# Patient Record
Sex: Male | Born: 1955 | Race: Black or African American | Hispanic: No | Marital: Single | Smoking: Former smoker
Health system: Southern US, Community
[De-identification: ages and names within clinical notes are randomized; demographics above are authoritative.]

---

## 2004-01-04 ENCOUNTER — Other Ambulatory Visit: Payer: Self-pay

## 2006-02-10 ENCOUNTER — Ambulatory Visit: Payer: Self-pay | Admitting: Gastroenterology

## 2006-12-20 ENCOUNTER — Ambulatory Visit: Payer: Self-pay | Admitting: Orthopedic Surgery

## 2007-01-16 ENCOUNTER — Encounter: Admission: RE | Admit: 2007-01-16 | Discharge: 2007-01-16 | Payer: Self-pay | Admitting: Neurosurgery

## 2007-01-26 ENCOUNTER — Emergency Department: Payer: Self-pay | Admitting: Unknown Physician Specialty

## 2007-01-27 ENCOUNTER — Emergency Department: Payer: Self-pay

## 2007-03-14 ENCOUNTER — Encounter: Admission: RE | Admit: 2007-03-14 | Discharge: 2007-03-14 | Payer: Self-pay | Admitting: Neurosurgery

## 2009-01-21 ENCOUNTER — Emergency Department: Payer: Self-pay | Admitting: Internal Medicine

## 2011-06-07 ENCOUNTER — Inpatient Hospital Stay: Payer: Self-pay | Admitting: Specialist

## 2011-09-06 ENCOUNTER — Observation Stay: Payer: Self-pay | Admitting: Internal Medicine

## 2011-09-06 LAB — COMPREHENSIVE METABOLIC PANEL
Albumin: 4.4 g/dL (ref 3.4–5.0)
Alkaline Phosphatase: 105 U/L (ref 50–136)
Anion Gap: 35 — ABNORMAL HIGH (ref 7–16)
Calcium, Total: 9.4 mg/dL (ref 8.5–10.1)
Co2: 4 mmol/L — CL (ref 21–32)
EGFR (Non-African Amer.): >60
Glucose: 162 mg/dL — ABNORMAL HIGH (ref 65–99)
SGOT(AST): 76 U/L — ABNORMAL HIGH (ref 15–37)

## 2011-09-06 LAB — URINALYSIS, COMPLETE
Ph: 5 (ref 4.5–8.0)
Protein: 100
RBC,UR: 9 /HPF (ref 0–5)
Specific Gravity: 1.015 (ref 1.003–1.030)
Squamous Epithelial: NONE SEEN
WBC UR: 4 /HPF (ref 0–5)

## 2011-09-06 LAB — CBC
MCH: 30.4 pg (ref 26.0–34.0)
Platelet: 217 10*3/uL (ref 150–440)
RBC: 5.34 10*6/uL (ref 4.40–5.90)
RDW: 17.5 % — ABNORMAL HIGH (ref 11.5–14.5)

## 2011-09-06 LAB — ETHANOL
Ethanol %: <0.003 % (ref 0.000–0.080)
Ethanol: <3 mg/dL

## 2011-09-06 LAB — DRUG SCREEN, URINE
Amphetamines, Ur Screen: NEGATIVE (ref ?–1000)
Methadone, Ur Screen: NEGATIVE (ref ?–300)
Opiate, Ur Screen: NEGATIVE (ref ?–300)
Tricyclic, Ur Screen: NEGATIVE (ref ?–1000)

## 2011-09-06 LAB — CK: CK, Total: 205 U/L (ref 35–232)

## 2011-09-07 LAB — BASIC METABOLIC PANEL
Anion Gap: 13 (ref 7–16)
BUN: 11 mg/dL (ref 7–18)
Chloride: 106 mmol/L (ref 98–107)
Creatinine: 1.22 mg/dL (ref 0.60–1.30)
EGFR (Non-African Amer.): >60
Potassium: 3.9 mmol/L (ref 3.5–5.1)

## 2011-09-07 LAB — CBC WITH DIFFERENTIAL/PLATELET
Basophil #: 0 10*3/uL (ref 0.0–0.1)
Eosinophil %: 0.2 %
HGB: 13.1 g/dL (ref 13.0–18.0)
Monocyte %: 13.4 %
Neutrophil %: 60.6 %
Platelet: 156 10*3/uL (ref 150–440)
RBC: 4.32 10*6/uL — ABNORMAL LOW (ref 4.40–5.90)
WBC: 9.8 10*3/uL (ref 3.8–10.6)

## 2011-09-07 LAB — TROPONIN I: Troponin-I: 0.1 ng/mL — ABNORMAL HIGH

## 2011-09-08 LAB — PROT IMMUNOELECTROPHORES(ARMC)

## 2012-12-20 ENCOUNTER — Emergency Department: Payer: Self-pay | Admitting: Emergency Medicine

## 2013-03-28 IMAGING — CT CT HEAD WITHOUT CONTRAST
2 series · 15 of 30 positions shown, 19 images · non-contrast
Comparison: none

REASON FOR EXAM: seizures
COMMENTS:

PROCEDURE:     CT  - CT HEAD WITHOUT CONTRAST  - September 06, 2011  [DATE]
RESULT:     Comparison:  None
TECHNIQUE: Multiple axial images from the foramen magnum to the vertex were
obtained without IV contrast.

[Series 2: without · axial · non-contrast · 0.45mm/px · z∈[-167,-27]mm · 13 of 34 slices shown, 17 images]
[im 3/34  brain]
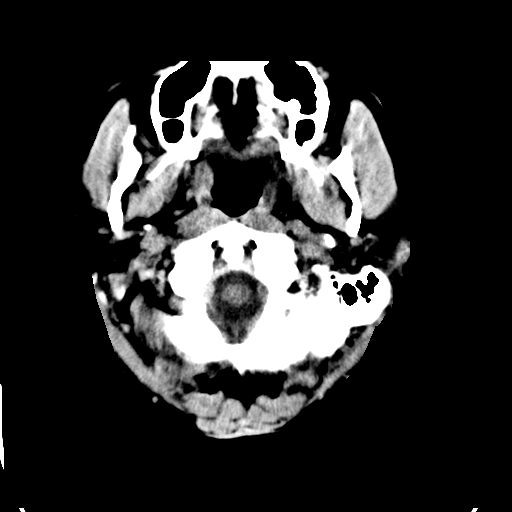
[im 3/34  bone]
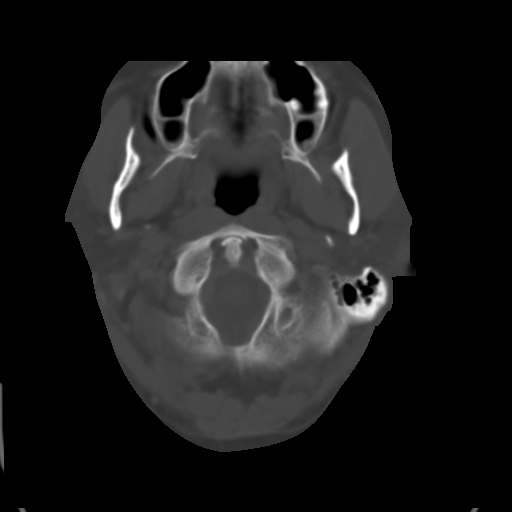
[im 5/34  brain]
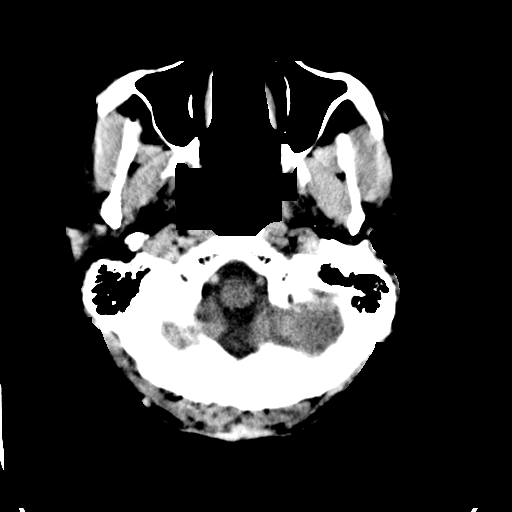
[im 8/34  brain]
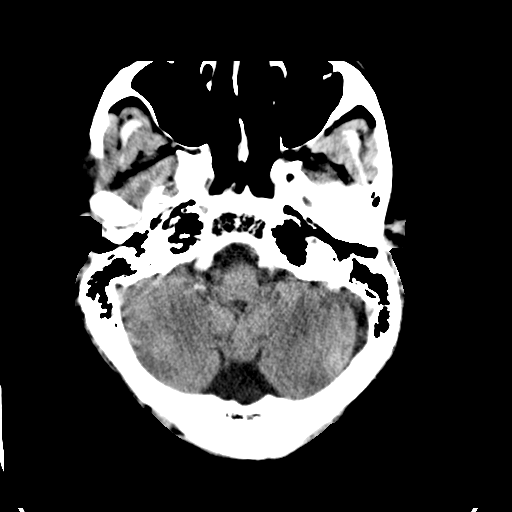
[im 10/34  brain]
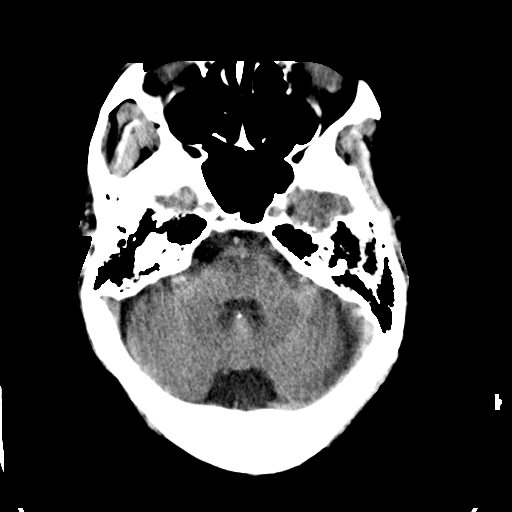
[im 12/34  brain]
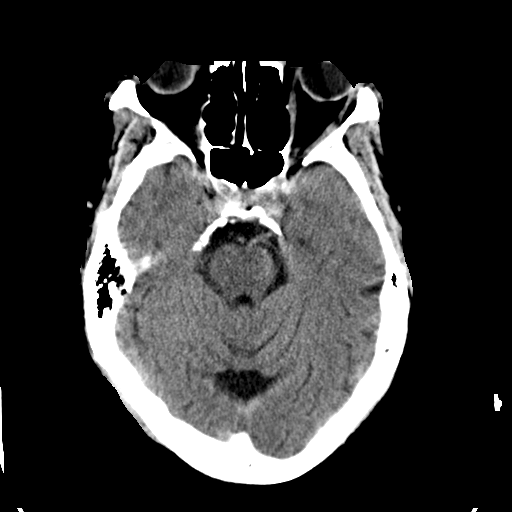
[im 12/34  bone]
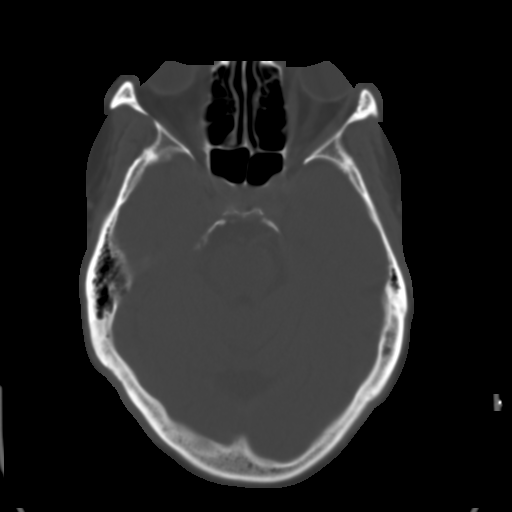
[im 15/34  brain]
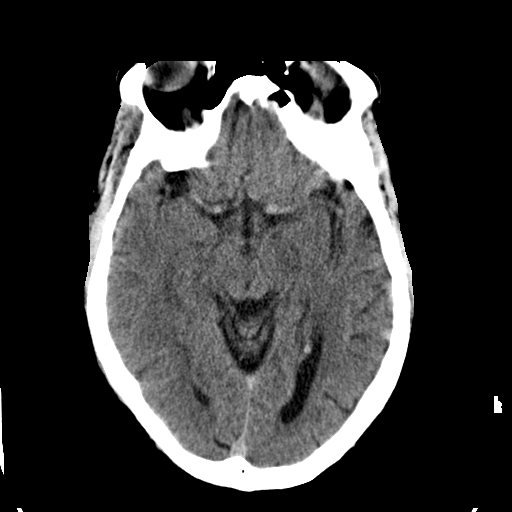
[im 17/34  brain]
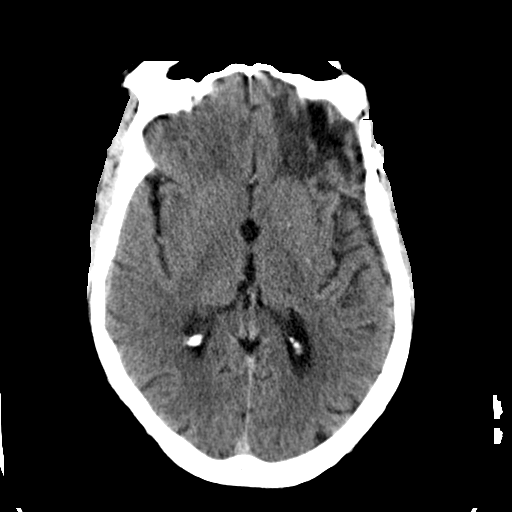
[im 19/34  brain]
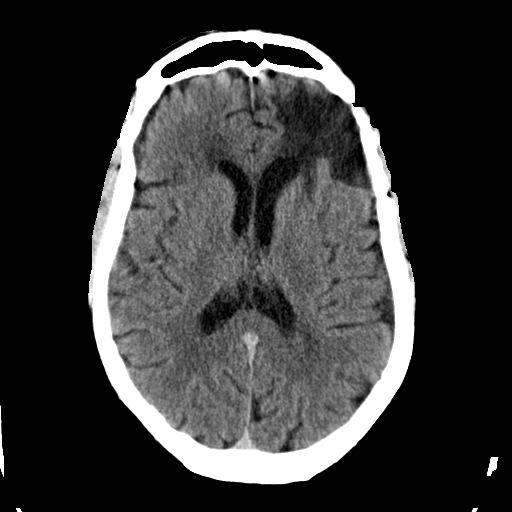
[im 22/34  brain]
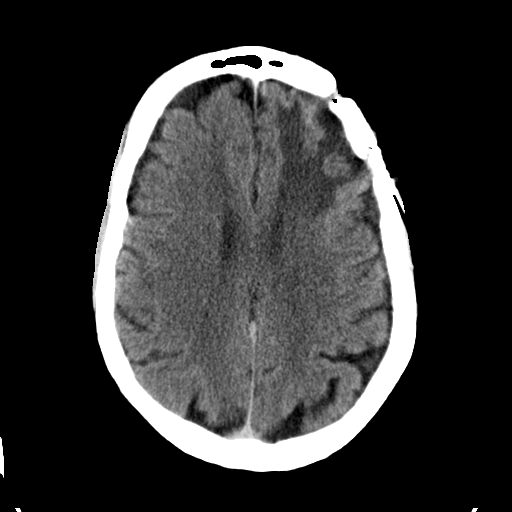
[im 22/34  bone]
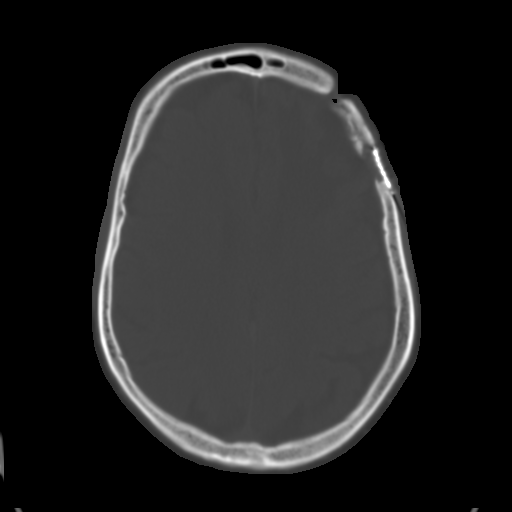
[im 24/34  brain]
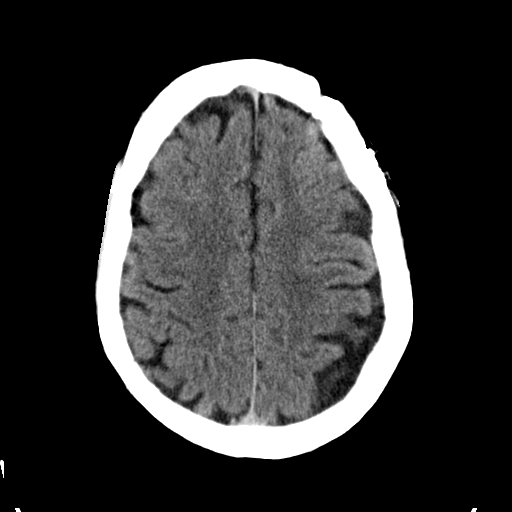
[im 26/34  brain]
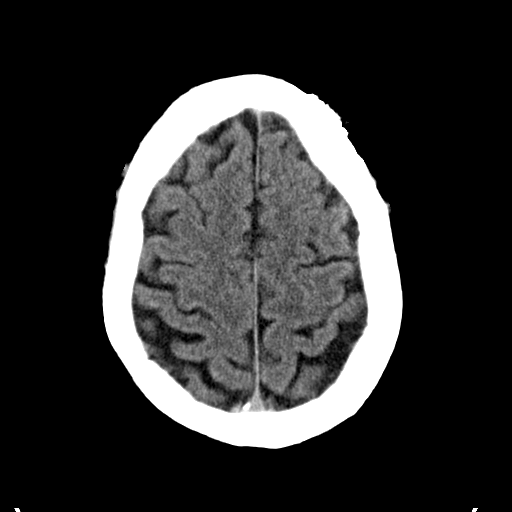
[im 29/34  brain]
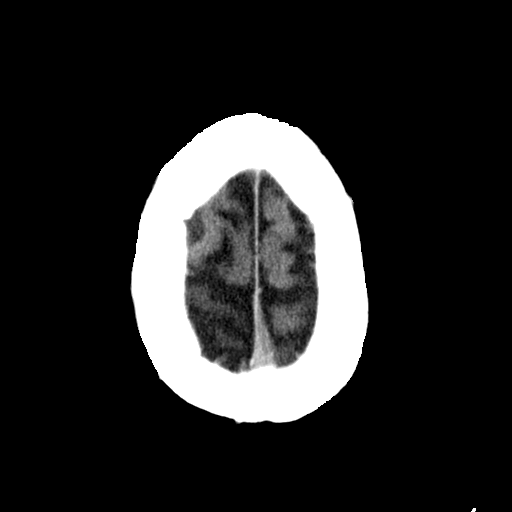
[im 31/34  brain]
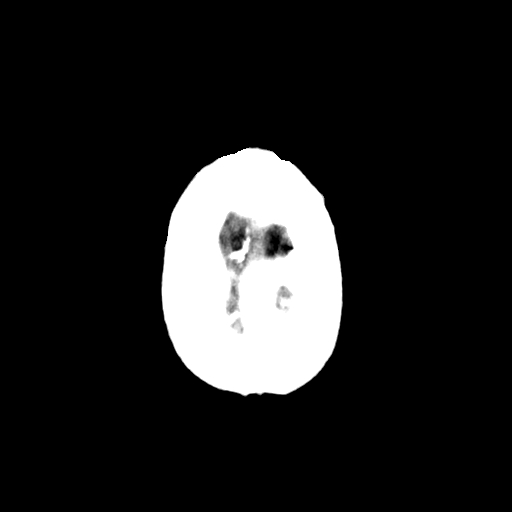
[im 31/34  bone]
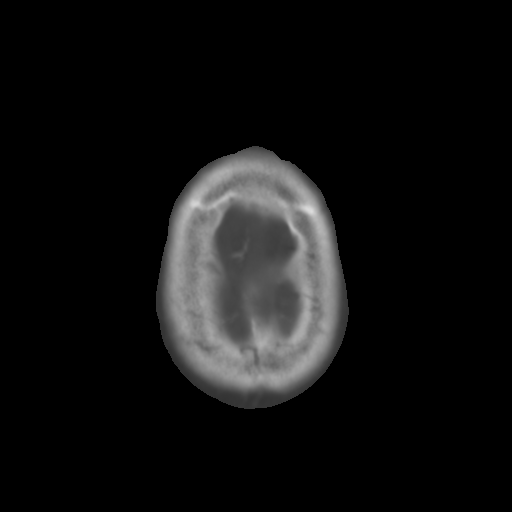

[Series 3: bone · axial · 0.45mm/px · z∈[-167,-142]mm · 2 of 34 slices shown]
[im 3/34  bone]
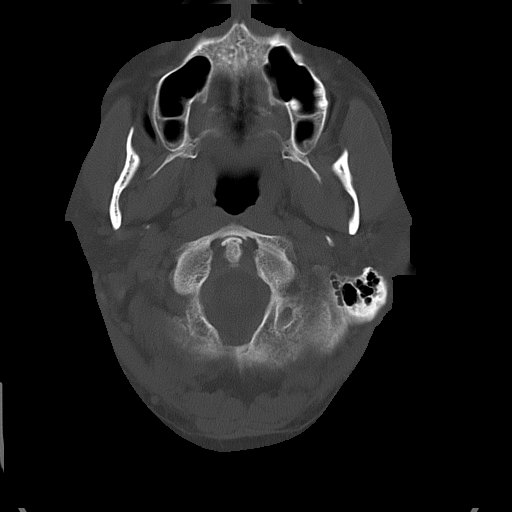
[im 8/34  bone]
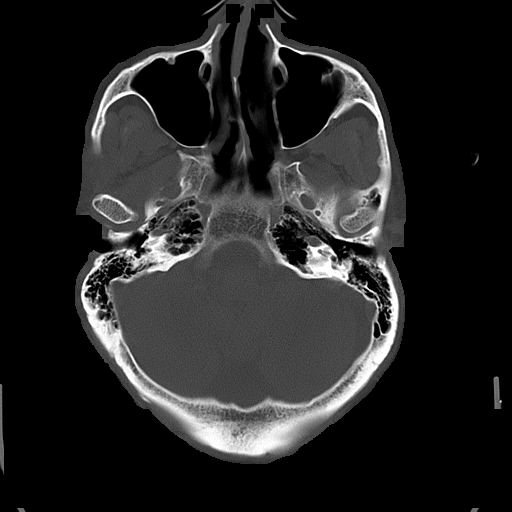

[15 of 30 positions shown; findings below may reference images not displayed]

FINDINGS: There is no evidence of mass effect, midline shift, or extra-axial fluid
collections.  There is no evidence of a space-occupying lesion or
intracranial hemorrhage. There is no evidence of a cortical-based area of
acute infarction. There is left frontal encephalomalacia. There is
generalized cerebral atrophy. There is periventricular white matter low
attenuation likely secondary to microangiopathy.

The ventricles and sulci are appropriate for the patient's age. The basal
cisterns are patent.

Visualized portions of the orbits are unremarkable. The visualized portions
of the paranasal sinuses and mastoid air cells are unremarkable.
Cerebrovascular atherosclerotic calcifications are noted.

There is a left frontal craniotomy.
IMPRESSION: No acute intracranial process.

## 2013-03-29 IMAGING — CR ORBITS FOR FOREIGN BODY - 2 VIEW
1 series · 3 of 3 positions shown · non-contrast
Comparison: none

REASON FOR EXAM: metal clearance
COMMENTS:

PROCEDURE:     DXR - DXR ORBITS FOR MRI CLEARANCE  - September 07, 2011  [DATE]
RESULT:     Metallic density is noted over the right orbit. MRI cannot be
performed. The patient has had a prior craniotomy.

[Series 1: ap · 0.17mm/px · 3 of 3 slices shown]
[im 1/3]
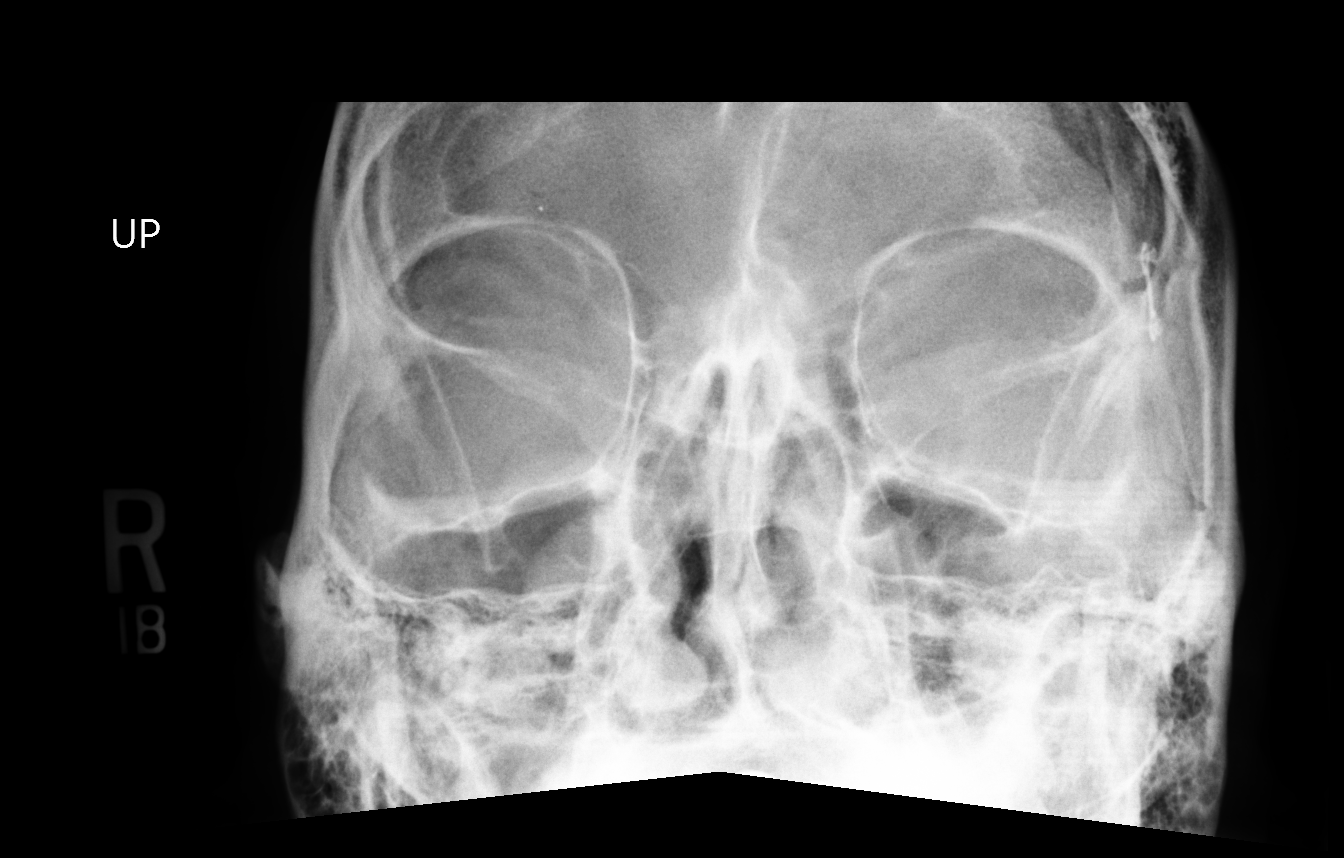
[im 2/3]
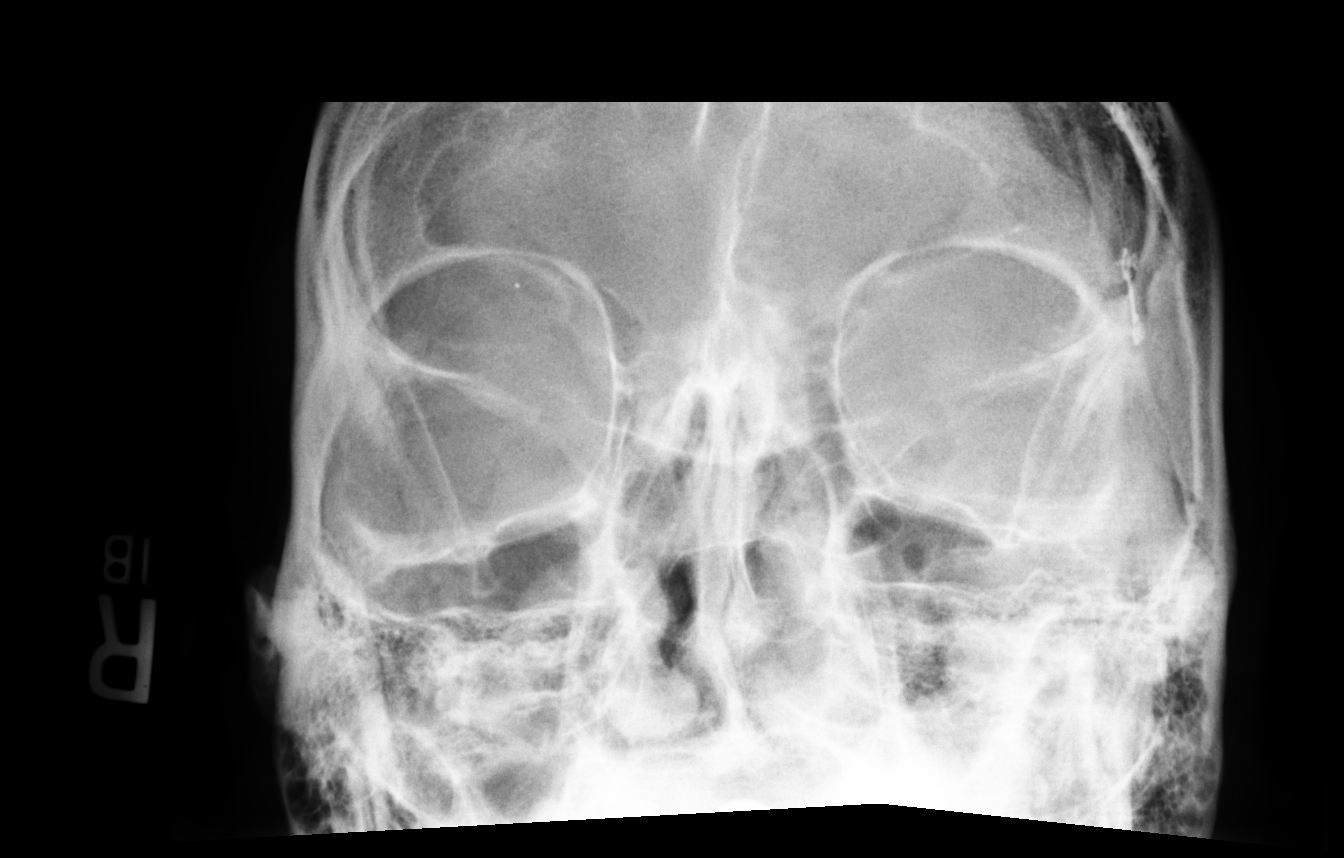
[im 3/3]
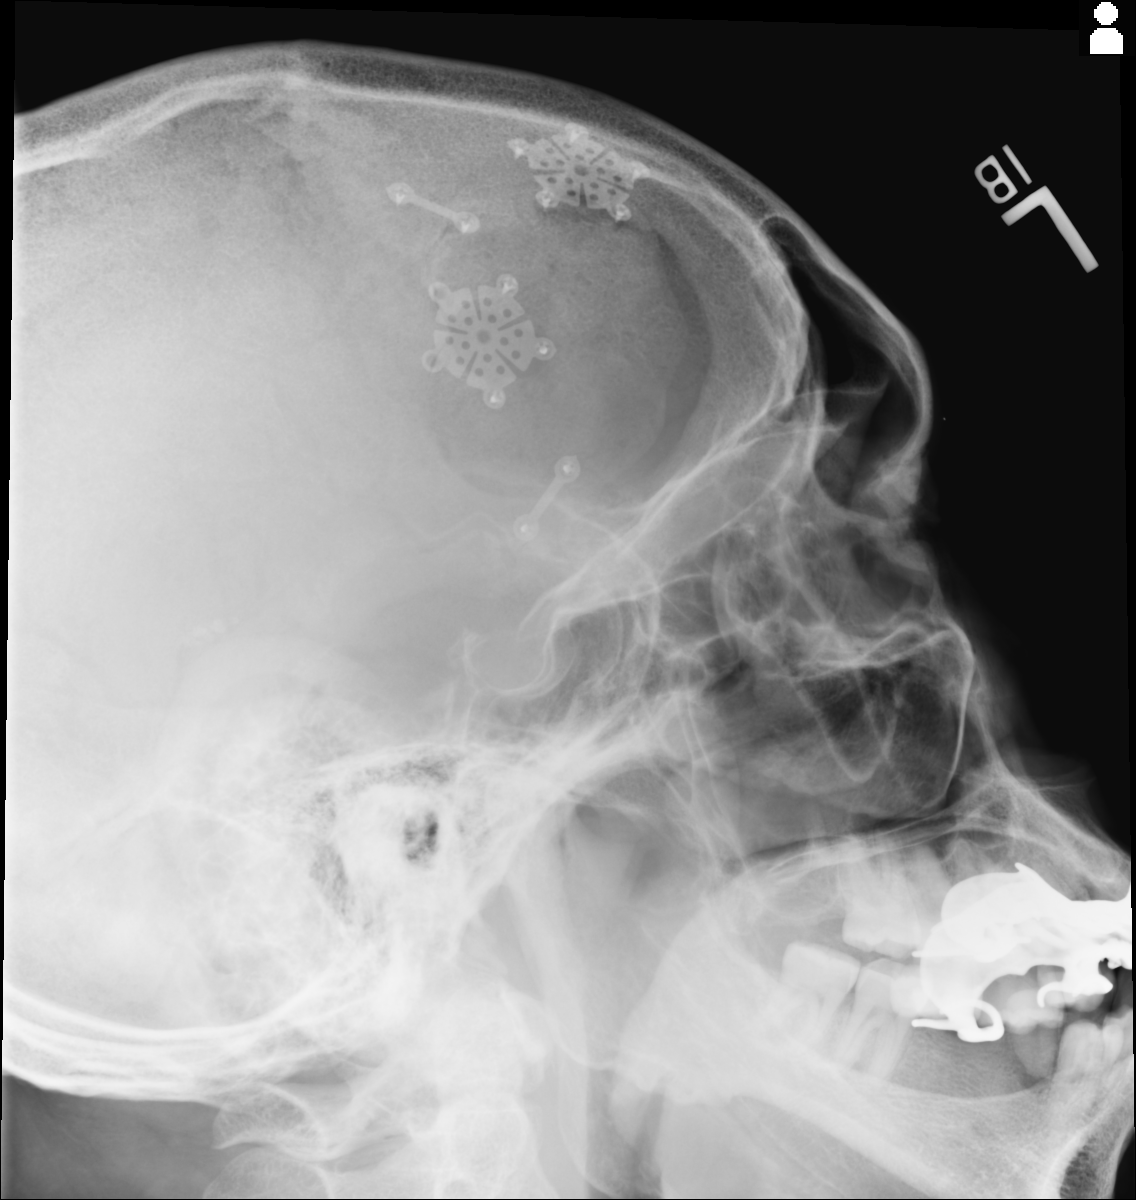

[3 of 3 positions shown; findings below may reference images not displayed]

IMPRESSION: Subcutaneous metallic density is noted over the right orbital region. The
patient cannot be cleared for MRI.

## 2013-03-29 IMAGING — US US CAROTID DUPLEX BILAT
1 series · 17 of 24 positions shown · non-contrast
Comparison: none

REASON FOR EXAM: ? syncope
COMMENTS:

[Series 1: us carotid duplex bilat · 17 of 52 slices shown]
[im 1/52]
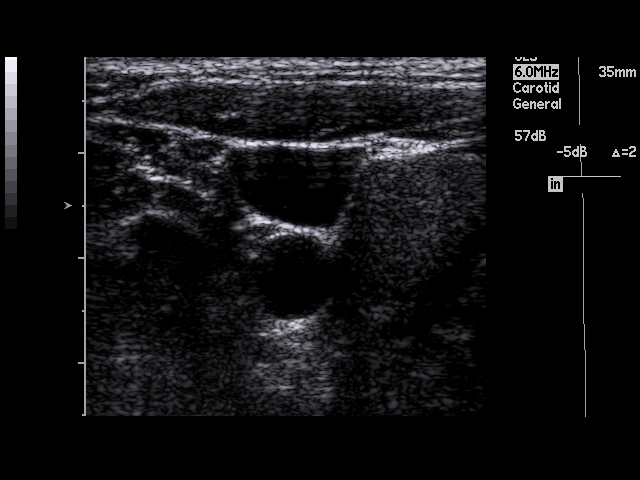
[im 5/52]
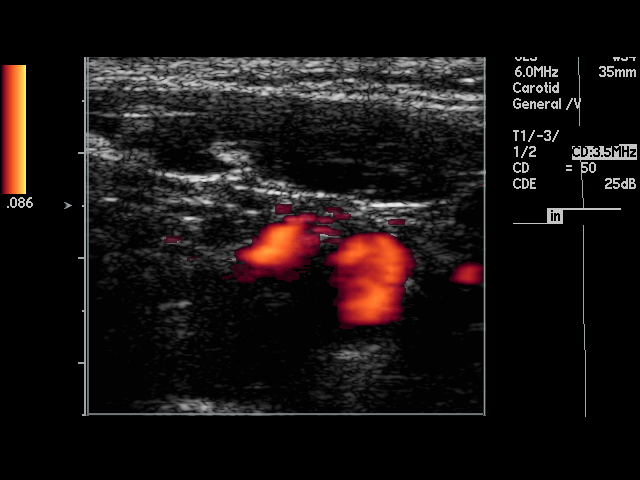
[im 7/52]
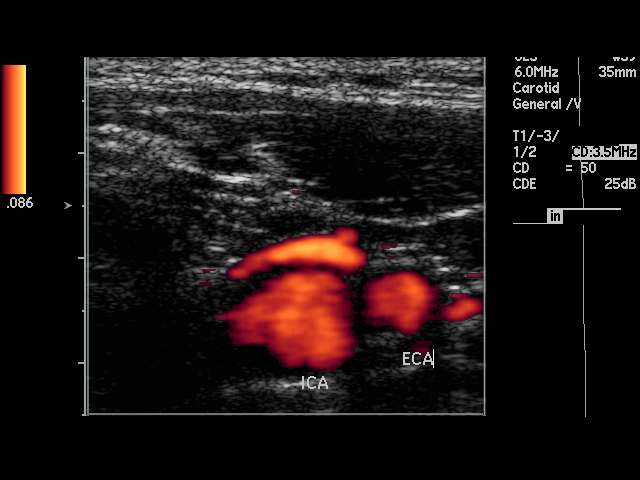
[im 9/52]
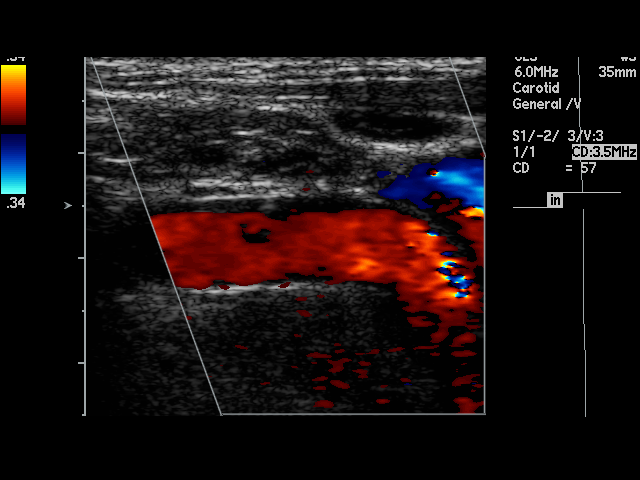
[im 14/52]
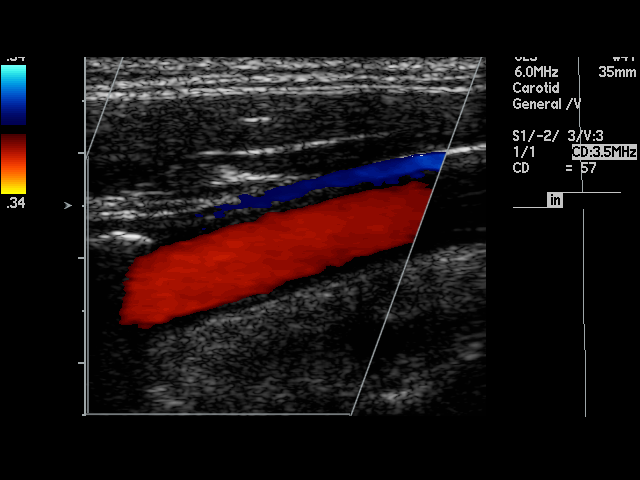
[im 16/52]
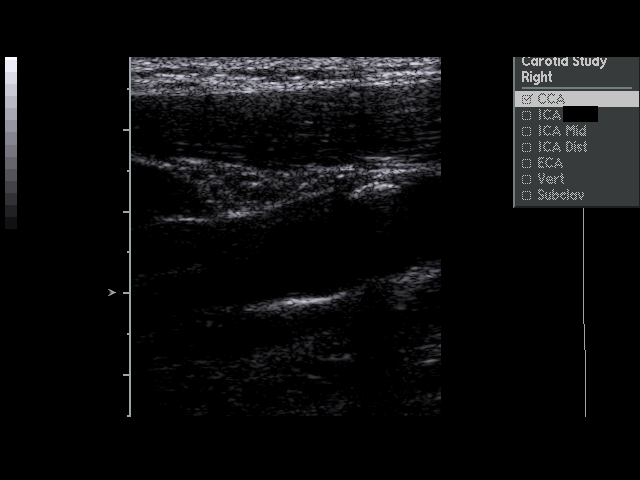
[im 20/52]
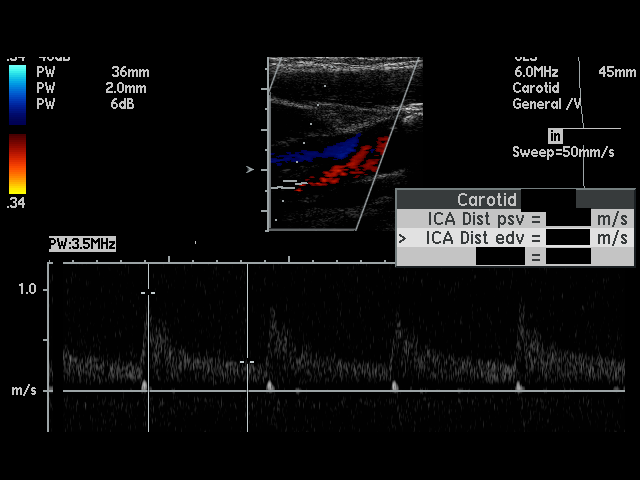
[im 23/52]
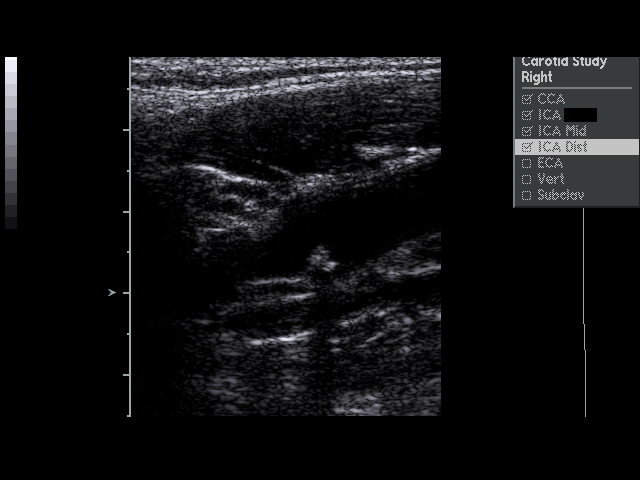
[im 27/52]
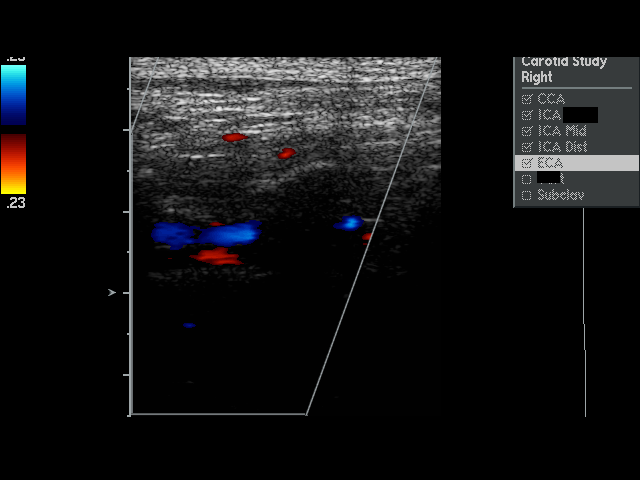
[im 29/52]
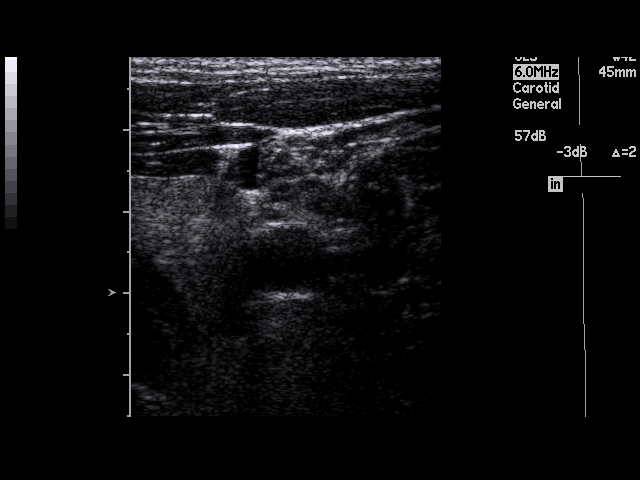
[im 32/52]
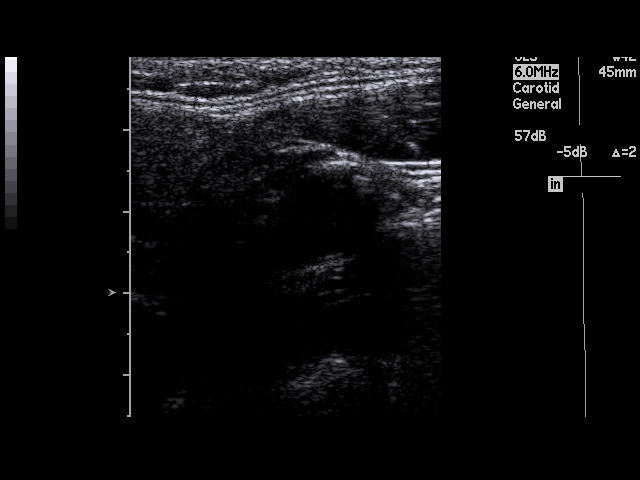
[im 36/52]
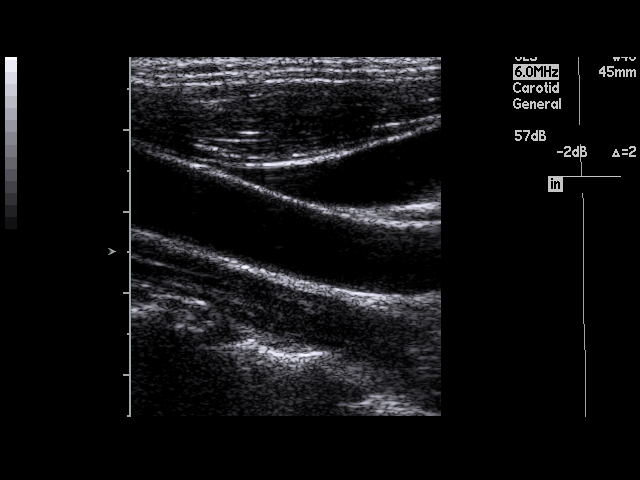
[im 38/52]
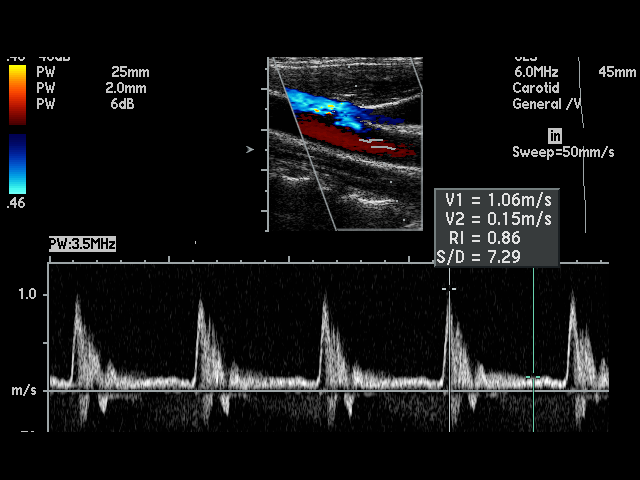
[im 43/52]
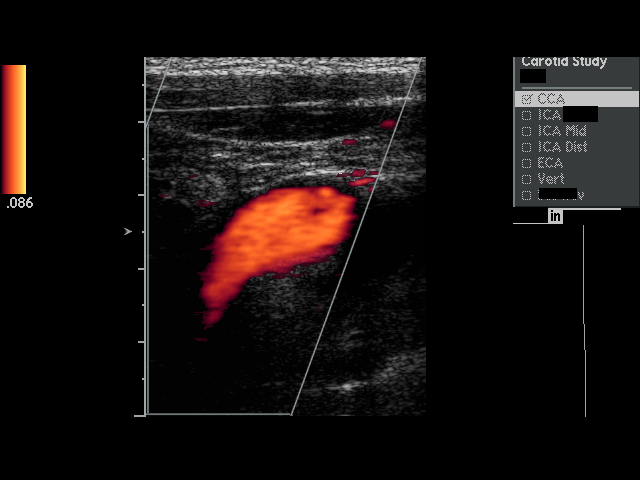
[im 45/52]
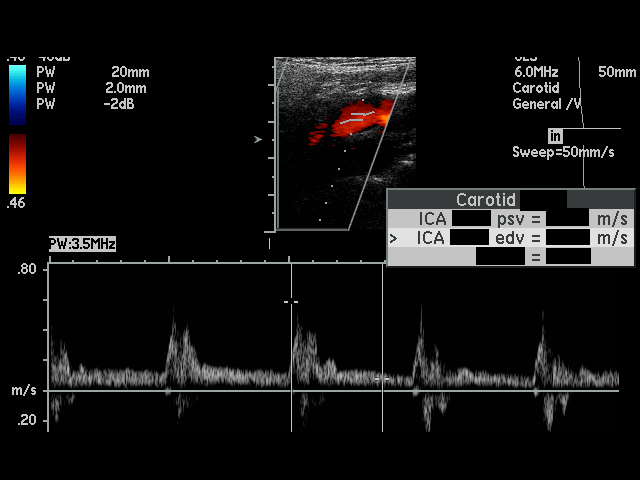
[im 47/52]
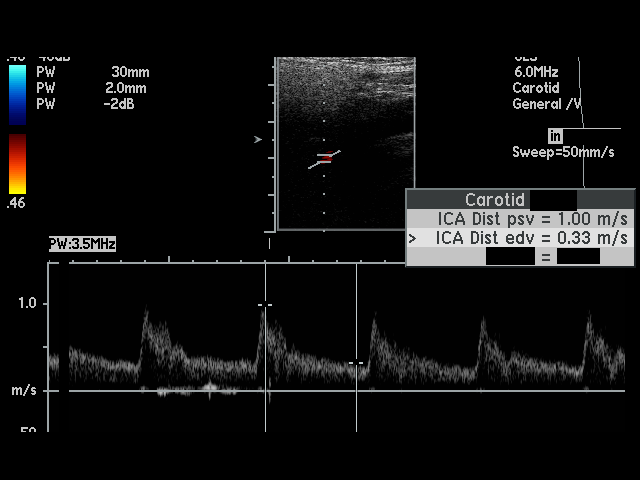
[im 52/52]
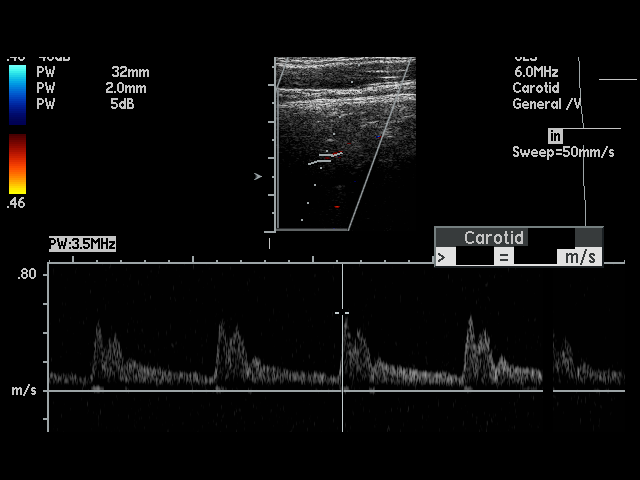

[17 of 24 positions shown; findings below may reference images not displayed]

PROCEDURE:     US  - US CAROTID DOPPLER BILATERAL  - September 07, 2011  [DATE]

RESULT:     Carotid Doppler interrogation demonstrates shadowing from
atherosclerotic calcified plaque in the carotid bifurcation region and
proximal internal carotids bilaterally. Visually, there does not appear to
be significant stenosis. No significant plaque is seen in the common carotid
region. Antegrade flow is seen in the vertebral arteries bilaterally. The
peak systolic velocities show normal measurements with internal to common
carotid peak systolic velocity ratio of 1.04 measured on the right and
on the left.
IMPRESSION: Minimal calcified plaque in the internal carotid regions.
No evidence of hemodynamically significant stenosis.

## 2014-02-15 ENCOUNTER — Emergency Department: Payer: Self-pay | Admitting: Emergency Medicine

## 2014-10-02 ENCOUNTER — Encounter
Admit: 2014-10-02 | Disposition: A | Discharge status: Home / Self Care | Payer: Self-pay | Attending: Family Medicine | Admitting: Family Medicine

## 2014-10-13 NOTE — H&P (Signed)
PATIENT NAME:  Jesse Ashley, IIAMS MR#:  [id number] DATE OF BIRTH:  04-21-1956  DATE OF ADMISSION:  06/07/2011  PRIMARY CARE PHYSICIAN: Vonita Moss, MD   HISTORY OF PRESENT ILLNESS: The patient is a 59 year old African American male with past medical history significant for history of hypertension, however, not on any blood pressure medications, history of brain tumor diagnosed and treated with surgery on the left side frontal area of his brain which was benign two years ago who presented to the hospital with complaints of not feeling well. According to the patient, he was doing well up until 2 or 3 days ago when he started feeling like he was hurting all over his body. It has been happening since Friday. He denies any falls or contusions. Denies any fights. Today in the morning he woke up on the floor in his bedroom. He got up and he went to bed. He still napped and then he tried to get up from the bed, however, he felt so weak that it took him three hours to get out of his bed because of the pain all over his body. Today he could not tolerate it any longer and he decided to come to the Emergency Room. In the Emergency Room, he was noted to have right lip swelling as well as injury in the left upper lip. He also states that he has some tongue discomfort as if he bit the tongue. Hospitalist services were contacted for admission. Moreover, he was also noted to have elevated troponin.   PAST MEDICAL HISTORY:  1. History of hypertension, however, the patient is off blood pressure medications now.  2. History of colonoscopy by Dr. Servando Snare in 2007 which revealed internal hemorrhoids for GI bleed.   3. History of brain tumor approximately two years ago, noncancerous, in the left side of his brain status post surgery, craniotomy. The patient does not know what kind of brain surgery or what kind of brain tumor he had.  MEDICATIONS: 1. Fish Oil 3 pills daily. 2. Ginseng tablets once daily.  3. Multivitamins once  daily.   PAST SURGICAL HISTORY: As above, brain tumor surgery two years ago, noncancerous, left frontal aspect of his brain.   ALLERGIES: None.   FAMILY HISTORY: Negative for early coronary artery disease, hypertension, cancer, or CVAs. The patient's father had enlarged heart. The patient's mother had kidney disease. The patient's sister died of unknown cancer.   SOCIAL HISTORY: The patient is single, however, he has three girls and one boy. He drank beer last on last Thursday, which was three days ago. He usually drinks 18 beer pack a week. He is unemployed now. He is drawing unemployment benefits. No drug abuse according to him and no tobacco abuse.   REVIEW OF SYSTEMS: Positive for weakness all over his body, muscle achiness all over his body, some wheezing in his lungs, also episode of questionable syncope waking up in the morning and not knowing what exactly happened to him. Otherwise, denies fevers, chills, fatigue. Admits of some weakness in his upper and lower extremities. Denies any weight loss or gain. In regards to eyes, denies any blurry vision, double vision, glaucoma, cataracts. ENT: Denies any tinnitus, allergies, epistaxis, sinus pain, dentures, difficulty swallowing. RESPIRATORY: Denies any cough, asthma, COPD. CARDIOVASCULAR: Denies chest pains, orthopnea, edema, arrhythmias, or palpitations. GASTROINTESTINAL: Denies nausea, vomiting, diarrhea, constipation, abdominal pain. GENITOURINARY: Denies dysuria, hematuria, frequency, incontinence. ENDOCRINOLOGY: Denies polydipsia, nocturia, thyroid problems, heat or cold intolerance, or thirst. HEMATOLOGIC: Denies anemia, easy bruising, .  SKIN: Denies any acne, rashes, lesions, change in moles. MUSCULOSKELETAL: Denies arthritis, cramps, swelling, gout. NEUROLOGICAL: Denies numbness, epilepsy, tremor. PSYCHIATRY: Denies anxiety, insomnia, or depression.   PHYSICAL EXAMINATION:  VITAL SIGNS: On arrival to the hospital, temperature 99.5, pulse  81, respiration rate 20, blood pressure 162/87, saturation 97% on room air.   GENERAL: This is a well developed, well nourished [place] in no significant distress laying on the stretcher.   HEENT: His pupils are equal and reactive to light. Extraocular muscles intact. No icterus or conjunctivitis. Has normal hearing. No pharyngeal erythema. Mucosa is moist. The patient does have swollen lip, especially right upper lip area and also some swelling on his tongue but I am not able to see any injuries in his tongue otherwise.   NECK: Neck did not reveal any masses, supple, nontender. Thyroid not enlarged. No adenopathy. No JVD or carotid bruits bilaterally. Full range of motion.   LUNGS: Clear to auscultation in all fields. No labored inspiration, increased effort, dullness to percussion, or overt respiratory distress.   CARDIOVASCULAR: S1 and S2 appreciated. No murmurs, gallops, or rubs noted. PMI not lateralized. Chest is nontender to palpation. 1+ pedal pulses. No lower extremity edema, calf tenderness, or cyanosis noted.   ABDOMEN: Soft, nontender. Bowel sounds are present. No hepatosplenomegaly or masses were noted.   RECTAL: Deferred.   MUSCULOSKELETAL: Muscle strength able to move all extremities. No cyanosis, degenerative joint disease, or kyphosis. Gait is not tested. The patient does have significant tenderness of upper muscle groups as well as lower muscle groups.   SKIN: Skin did not reveal any rashes, lesions, erythema, nodularity, or induration. It was warm and dry to palpation.   LYMPH: No adenopathy in the cervical region.   NEUROLOGICAL: Cranial nerves grossly intact. Sensory is intact. No dysarthria or aphasia.   PSYCH: The patient is alert, oriented to time, person, and place, cooperative. Memory is good. No significant confusion, agitation, or depression.   LABORATORY, DIAGNOSTIC, AND RADIOLOGICAL DATA: BMP showed total creatinine of 1.32, otherwise unremarkable  study. Liver enzymes, AST of 523, ALT 129, otherwise unremarkable liver enzymes. CK total pending. MB fraction 8.5. Troponin-I 1.2. Urine drug screen is pending. CBC white blood cell count 11.9, hemoglobin 14.4, platelets 158. Influenza test is negative.   EKG normal sinus rhythm at 79 beats per minute, normal axis, possible left atrial enlargement per EKG criteria, left ventricular hypertrophy with QRS widening of 164 ms, nonspecific T wave abnormality. Chest x-ray showed normal lung fields, normal cardiac silhouette. No pneumonia or CHF was noted. CT of head without contrast 06/07/2011 revealed no acute intracranial process, left frontal lobe encephalomalacia from prior surgery, recurrent or residual malignancy cannot be entirely excluded given the lack of intravenous contrast.   ASSESSMENT AND PLAN:  1. Syncope of unclear etiology at this time concerning for cardiogenic as the patient has elevated troponin. Will admit patient to telemetry. Will get echocardiogram. Will start patient on metoprolol, nitroglycerin topically, as well as aspirin and Lovenox. Will check cardiac enzymes x3. Will get cardiologist involved for further recommendations.  2. Elevated troponin. As above, will get echo and cardiac enzymes x3. Will get cardiologist involved. 3. Elevated transaminases questionable due to alcohol abuse. Get ultrasound of abdomen. Will follow the patient's liver enzymes in the morning.  4. Renal insufficiency, questionable dehydration. Will continue IV fluids. Get urinalysis to rule out urinary tract infection.  5. Fever. The patient is Influenza negative. Chest x-ray seemed to be okay and he denies  any symptoms. Will obtain blood cultures x2. Will start patient on Rocephin IV as patient's fever is of unclear etiology at this time. I am concerned that patient may be septic. Will also get urinalysis.  6. Alcohol abuse. Will start patient on CIWA scale if needed.   TIME SPENT: One hour.    ____________________________ Katharina Caper, MD rv:drc D: 06/07/2011 22:17:59 ET T: 06/08/2011 06:46:12 ET JOB#: [id number]  cc: Katharina Caper, MD, <Dictator> Steele Sizer, MD Dazani Norby MD ELECTRONICALLY SIGNED 07/18/2011 9:56

## 2014-10-13 NOTE — Discharge Summary (Signed)
PATIENT NAME:  Jesse Jesse Ashley, Jesse Jesse Ashley#:  [id number] DATE OF BIRTH:  30-Jul-1955  DATE OF ADMISSION:  09/06/2011 DATE OF DISCHARGE:  09/08/2011   PRESENTING COMPLAINT: Suspected seizures.   DISCHARGE DIAGNOSES:  1. Seizure versus syncope, most likely seizure disorder given patient unable to take medication due to financial reasons.  2. Hypertension.  3. History of brain tumor that was benign, removed in the past.  4. Hepatitis C on lab data.   CONDITION ON DISCHARGE: Fair. Vitals stable.   MEDICATIONS:  1. Keppra 750 p.o. b.i.d.  2. Aspirin 325 mg 2 tablets 3 times a week.  3. Metoprolol 25 mg b.i.d.  4. Omega-3 2 tablets daily.   FOLLOW-UP: [facility] to 2 days. The patient [facility]. He will be given two days of Keppra from the hospital.   LABS ON DISCHARGE: Ultrasound Doppler minimal calcified plaque in the internal carotid region, no stenosis. MRI canceled secondary to not cleared given metallic objects around the orbit and in the abdomen. CBC within normal limits. Basic metabolic panel within normal limits. Right hip mild degenerative change. Right scapula no definite fracture noted. Urinalysis negative for urinary tract infection. Urine drug screen negative. CT of the head on admission did not show any acute intracranial process. Serum ethanol less than 0.003. Serum Keppra levels are still pending.   BRIEF SUMMARY OF HOSPITAL COURSE: Jesse Ashley. Jesse Jesse Ashley is a 59 year old African American gentleman with history of seizure disorder on Keppra who came in with:  1. Syncope versus seizure disorder. With the way the patient presented, it most likely appears to be seizure disorder secondary to noncompliance to medication. The patient states he is at times not able to afford the 10 dollars. He ran out of his medicine about a week ago. The patient's carotid ultrasounds were negative. He cannot have MRI due to metal in the abdomen from  prior surgery and metal around the orbit. Social Worker will provide two days worth of Keppra from the hospital. I did have a discussion with the patient. He does get Keppra for 10 dollar [facility] which he will try to get in the next two days. It was readdressed the importance of him being on Keppra given his seizure disorder.  2. Right hip pain and shoulder pain after fall. The x-rays did not show any acute abnormality.  3. History of alcohol. CIWA protocol was continued. The patient did not exhibit any signs or symptoms of alcohol withdrawal.   Hospital stay otherwise remained stable.   CODE STATUS: The patient remained a FULL CODE.   FOLLOW-UP: He will follow[facility]up [facility].   TIME SPENT: 40 minutes.   ____________________________ Wylie HailSona A. Allena KatzPatel, MD sap:drc D: 09/08/2011 10:13:24 ET T: 09/08/2011 13:39:27 ET JOB#: [id number]  cc: Viktoria Gruetzmacher A. Allena KatzPatel, MD, <Dictator> [facility] Willow OraSONA A Annet Manukyan MD ELECTRONICALLY SIGNED 09/27/2011 6:32

## 2014-10-13 NOTE — Consult Note (Signed)
PATIENT NAME:  Jesse Ashley, Jesse Ashley MR#:  [id number] DATE OF BIRTH:  1955/12/04  DATE OF CONSULTATION:  06/16/2011  REFERRING PHYSICIAN:   CONSULTING PHYSICIAN:  Lurline Del, MD  REASON FOR CONSULTATION: Abnormal CT showing colitis of the right colon as well as fever.   HISTORY OF PRESENT ILLNESS: This is a 59 year old African American male who was admitted almost 10 days ago with syncope and generalized body ache. The patient has had a rather complicated course. Since then, he has been spiking fever up to 102 to 103. Initially, he denied any nausea, vomiting, abdominal pain, or diarrhea although for the last two days he has been having some diarrhea as well. A CT scan of the abdomen and pelvis was done which showed some thickening of the wall of the ascending colon although there are no other signs of any acute intra-abdominal process. The patient denies any abdominal pain, although as mentioned above, he has been having some diarrhea over the last 2 to 3 days or so. The patient's white cell count was somewhat elevated on admission, although it has been normal in the last few days. No other significant symptoms were reported by the patient. Initially, he had some cough, but, again, the chest x-ray was quite unremarkable as well as urinalysis. The patient has been evaluated by ED and no clear source of fever has been determined yet.   PAST MEDICAL HISTORY:  1. Hypertension.  2. History of colonoscopy in 2007 which was unremarkable.  3. History of a benign brain tumor for which he had surgery about two years ago.   MEDICATIONS AT HOME:  1. Fish oil.  2. Ginseng. 3. Multivitamins.   ALLERGIES: None.   CURRENT MEDICATIONS:  1. Tylenol. 2. Aspirin. 3. Heparin. 4. Metoprolol. 5. Nitroglycerin. 6. Zofran. 7. Folic acid. 8. Lorazepam. 9. Thiamine. 10. Keppra. 11. Levaquin. Levaquin which was started two days ago. 12. Flagyl. Metronidazole which was started today.   REVIEW OF SYSTEMS:  Grossly negative.   PHYSICAL EXAMINATION:  GENERAL: Well-built male. He does not appear to be in any acute distress. He does not appear to be toxic or septic.   VITAL SIGNS: He has been afebrile now with a temperature of 97.8, although he has spiked a fever of 102.9 yesterday. Pulse is 62, blood pressure 124/77.   HEENT: Unremarkable.   NECK: Neck veins are flat.   LUNGS: Grossly clear to auscultation bilaterally with fair air entry and no added sounds.   CARDIOVASCULAR: Regular rate and rhythm. No gallops or murmurs were heard.   ABDOMEN: Abdomen is fairly soft and benign. No rebound or guarding was noted. No hepatosplenomegaly or ascites.   EXTREMITIES: No edema. No clubbing or cyanosis.   NEUROLOGIC: Examination appears to be unremarkable.   LABORATORY, DIAGNOSTIC, AND RADIOLOGICAL DATA: White cell count is 4.1, hemoglobin 12.7, hematocrit 39, platelet count 219. CPK was very high on admission consistent with rhabdomyolysis at 629 today. Liver enzymes are slightly elevated basically mainly transaminases, ALT 79, AST 61, bilirubin is normal as well as alkaline phosphatase. Stool for C. difficile toxin is negative. CT scan as above.   ASSESSMENT AND PLAN: The patient is with high fever of unknown origin. Work-up so far has been fairly unremarkable. The presence of colitis on the CT scan and mild diarrhea does not explain the patient's fever. The patient has no leukocytosis either. Sedimentation rate is somewhat elevated at 43, raising concerns about inflammatory process such as vasculitis, etc. I agree with the current antibiotic  regimen. Agree with the recommendations of Dr. Leavy CellaBlocker. The patient's diarrhea seems to be improving and the patient's abdominal examination is fairly benign. The patient may benefit from a colonoscopy after the resolution of fever and acute colitis. Will continue to follow and make further recommendations. Thank you so much for involving me in the care of Mr. Jesse AlexanderJohn  Ashley.    ____________________________ Lurline DelShaukat Ianna Salmela, MD si:ap D: 06/16/2011 18:14:30 ET T: 06/17/2011 09:56:59 ET JOB#: [id number]  cc: Lurline DelShaukat Francheska Villeda, MD, <Dictator> Rosalyn GessMichael E. Blocker, MD Lurline DelSHAUKAT Cambrie Sonnenfeld MD ELECTRONICALLY SIGNED 06/26/2011 14:36

## 2014-10-13 NOTE — Consult Note (Signed)
PATIENT NAME:  Jesse Ashley, Jesse Ashley MR#:  [id number] DATE OF BIRTH:  January 08, 1956  DATE OF CONSULTATION:  06/08/2011  REFERRING PHYSICIAN:  Katharina Caper, MD  CONSULTING PHYSICIAN:  Dwayne D. Juliann Pares, MD  PRIMARY CARE PHYSICIAN: Vonita Moss, MD   INDICATION FOR CONSULTATION: Elevated cardiac enzymes, possible non-Q-wave myocardial infarction, hypertension, syncope.   HISTORY OF PRESENT ILLNESS: Jesse Ashley is a 59 year old African American male with history of hypertension, weakness, fatigue, brain tumor diagnosed and treated, left frontal surgery several years ago. He was doing well up until about 2 to 3 days ago when he started feeling weak, body aches everywhere. He had much more weakness and fatigue. He woke up on the floor of his bedroom, may have had a syncopal episode. He has been sleeping a lot, feeling weak and tired. He got worse. He finally came to the Emergency Room. He is complaining of facial swelling, lip swelling of upper lip of unclear etiology and of his tongue also. He was admitted for further evaluation and management.   REVIEW OF SYSTEMS: Possible blackout spell. Possible syncope. Denies nausea or vomiting. Denies fever, chills, or sweats. Complained of weakness, fatigue, tiredness. No weight loss, no weight gain. No hemoptysis or hematemesis. No bright red blood per rectum. No vision change or hearing change. No sputum production or cough.   PAST MEDICAL HISTORY:  1. Hypertension. 2. Internal hemorrhoids. 3. Lower GI bleeding. 4. Brain tumor.   PAST SURGICAL HISTORY: Brain tumor surgery which he states was noncancerous in the frontal lobe.   ALLERGIES: None.   FAMILY HISTORY: Negative for coronary artery disease, enlarged heart, kidney disease, cancer.   SOCIAL HISTORY: Single. Four children. Drank beer up until last Thursday, which was three days ago. Usually he drinks about 18 beers a week. Unemployment. No smoking.   MEDICATIONS:  1. Fish  Oil. 2. Ginseng. 3. Multivitamins.   PHYSICAL EXAMINATION:   VITAL SIGNS: Blood pressure 160/87, pulse 80, respiratory rate 20, afebrile.   HEENT: Normocephalic, atraumatic. Pupils equal and reactive to light.   GENERAL: The patient is lying in bed weak and fatigued.  NECK: Supple. No jugular venous distention, bruits, or adenopathy.   LUNGS: Clear to auscultation and percussion. No significant wheeze, rhonchi, or rale.   HEART: Regular rate and rhythm. No significant murmur, gallops, or rubs.   ABDOMEN: Benign. Positive bowel sounds. No rebound or guarding. No tenderness.   EXTREMITIES: Within normal limits. No cyanosis, clubbing, or edema.   NEUROLOGIC: Grossly intact.   SKIN: Normal.  LABORATORY, DIAGNOSTIC, AND RADIOLOGICAL DATA: Creatinine 1.32, AST 523, ALT 129. Troponin 1.2. White count 11.9, hemoglobin 14.4, platelet count 158. Influenza test was negative. EKG normal sinus rhythm, rate of 80, nonspecific findings, LVH bivoltage, QRS widening, interventricular conduction delay. Chest x-ray no pneumonia. CT of the head basically unchanged from previous.   ASSESSMENT:  1. Syncope. 2. Weakness. 3. Fatigue. 4. Renal insufficiency. 5. Elevated troponin.  6. Possible non-Q-wave myocardial infarction.   7. Elevated liver enzymes.  8. Fever. 9. History of possible alcohol abuse.   10. Possible dehydration.   PLAN:  1. Agree with admit.  2. Rule out for myocardial infarction.  3. Place in ICU. 4. Follow cardiac enzymes.  5. Follow-up EKG. 6. Hydrate the patient accordingly. 7. Possibly place on bicarb to help protect his kidneys.   8. Consider echocardiogram to evaluate LV function and wall motion.  9. Follow-up white count. 10. Rule out sepsis. 11. Re-evaluate his syncopal episode whether this was neurologic  or not.   12. Consider MRI but he may have brain clips which may prevent MRI. 13. Consider Nephrology input for his renal insufficiency.  14. Do not recommend  cardiac cath at this point.  15. Will continue to treat medically for now and follow-up with cardiac studies depending on how he improves.   ____________________________ Bobbie Stackwayne D. Juliann Paresallwood, MD ddc:drc D: 06/08/2011 22:13:39 ET T: 06/09/2011 07:51:21 ET JOB#: [id number]  cc: Dwayne D. Juliann Paresallwood, MD, <Dictator> Alwyn PeaWAYNE D CALLWOOD MD ELECTRONICALLY SIGNED 07/01/2011 5:58
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
## 2017-08-16 ENCOUNTER — Other Ambulatory Visit: Payer: Self-pay

## 2017-09-07 ENCOUNTER — Ambulatory Visit (INDEPENDENT_AMBULATORY_CARE_PROVIDER_SITE_OTHER): Payer: Medicare Other | Admitting: Urology

## 2017-09-07 VITALS — BP 146/91 | HR 82 | Resp 16 | Ht 76.0 in | Wt 272.8 lb

## 2017-09-07 DIAGNOSIS — N529 Male erectile dysfunction, unspecified: Secondary | ICD-10-CM

## 2017-09-07 MED ORDER — SILDENAFIL CITRATE 20 MG PO TABS: ORAL_TABLET | ORAL | 0 refills | Status: DC
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
## 2017-09-08 ENCOUNTER — Encounter: Payer: Self-pay | Admitting: Urology

## 2017-09-08 DIAGNOSIS — N529 Male erectile dysfunction, unspecified: Secondary | ICD-10-CM | POA: Insufficient documentation

## 2018-01-06 ENCOUNTER — Other Ambulatory Visit: Payer: Self-pay

## 2018-01-06 MED ORDER — SILDENAFIL CITRATE 20 MG PO TABS: ORAL_TABLET | ORAL | 0 refills | Status: AC

## 2018-01-10 ENCOUNTER — Telehealth: Payer: Self-pay | Admitting: Urology

## 2018-01-10 NOTE — Telephone Encounter (Signed)
Please call pharmacy to revise Rx of Sildenafil. Pharmacy states they can't dispense as written. Please call Dorene Grebeatalie @ [phone] can leave a detailed message, this is a secure pharmacist only line. Thanks.

## 2018-01-11 ENCOUNTER — Telehealth: Payer: Self-pay | Admitting: *Deleted

## 2018-01-11 ENCOUNTER — Encounter: Payer: Self-pay | Admitting: *Deleted

## 2018-01-11 DIAGNOSIS — Z87891 Personal history of nicotine dependence: Secondary | ICD-10-CM

## 2018-01-11 DIAGNOSIS — Z122 Encounter for screening for malignant neoplasm of respiratory organs: Secondary | ICD-10-CM

## 2018-01-11 NOTE — Telephone Encounter (Signed)
Received referral for initial lung cancer screening scan. Contacted patient and obtained smoking history,(former, quit 2018, 34.5 pack year) as well as answering questions related to screening process. Patient denies signs of lung cancer such as weight loss or hemoptysis. Patient denies comorbidity that would prevent curative treatment if lung cancer were found. Patient is scheduled for shared decision making visit and CT scan on 01/31/18.

## 2018-01-11 NOTE — Telephone Encounter (Signed)
Please call pharmacy

## 2018-01-11 NOTE — Telephone Encounter (Signed)
Left message for pharmacy to call.

## 2018-01-12 ENCOUNTER — Telehealth: Payer: Self-pay | Admitting: Urology

## 2018-01-12 NOTE — Telephone Encounter (Signed)
This rx was sent into the pharmacy already. Pt was given Sildenafil 100mg  instead. [facility]

## 2018-01-12 NOTE — Telephone Encounter (Signed)
Pt needs a refill for Sildenafil 20 mg sent to [facility].  He usually gets a 30 day supply.  He is completely out.  Please call pt [phone]

## 2018-01-12 NOTE — Telephone Encounter (Signed)
Sp[facility] w/ [facility], he advised that since pt is taking 100mg  1 hour prior to intercourse, they have a program that will allow the pt to get Sildenafil 100mg  #10, for $10. Pharmacist was authorized to revise rx for Sildenafil 100mg , #10, take 1 tablet prior to intercourse.

## 2018-01-26 ENCOUNTER — Telehealth: Payer: Self-pay | Admitting: Nurse Practitioner

## 2018-01-31 ENCOUNTER — Encounter: Payer: Self-pay | Admitting: Nurse Practitioner

## 2018-01-31 ENCOUNTER — Ambulatory Visit
Admission: RE | Admit: 2018-01-31 | Discharge: 2018-01-31 | Disposition: A | Discharge status: Home / Self Care | Payer: Medicare Other | Source: Ambulatory Visit | Attending: Oncology | Admitting: Oncology

## 2018-01-31 ENCOUNTER — Inpatient Hospital Stay: Payer: Medicare Other | Attending: Nurse Practitioner | Admitting: Nurse Practitioner

## 2018-01-31 DIAGNOSIS — Z87891 Personal history of nicotine dependence: Secondary | ICD-10-CM | POA: Insufficient documentation

## 2018-01-31 DIAGNOSIS — I700 Atherosclerosis of aorta: Secondary | ICD-10-CM | POA: Insufficient documentation

## 2018-01-31 DIAGNOSIS — Z122 Encounter for screening for malignant neoplasm of respiratory organs: Secondary | ICD-10-CM | POA: Insufficient documentation

## 2018-01-31 NOTE — Progress Notes (Signed)
In accordance with CMS guidelines, patient has met eligibility criteria including age, absence of signs or symptoms of lung cancer.  Social History   Tobacco Use  . Smoking status: Former Smoker    Packs/day: 0.75    Years: 46.00    Pack years: 34.50    Types: Cigarettes    Last attempt to quit: 11/2016    Years since quitting: 1.2  Substance Use Topics  . Alcohol use: Not on file  . Drug use: Not on file      A shared decision-making session was conducted prior to the performance of CT scan. This includes one or more decision aids, includes benefits and harms of screening, follow-up diagnostic testing, over-diagnosis, false positive rate, and total radiation exposure.   Counseling on the importance of adherence to annual lung cancer LDCT screening, impact of co-morbidities, and ability or willingness to undergo diagnosis and treatment is imperative for compliance of the program.   Counseling on the importance of continued smoking cessation for former smokers; the importance of smoking cessation for current smokers, and information about tobacco cessation interventions have been given to patient including [place] and 1800 quit [place] programs.   Written order for lung cancer screening with LDCT has been given to the patient and any and all questions have been answered to the best of my abilities.    Yearly follow up will be coordinated by Burgess Estelle, Thoracic Navigator.  Beckey Rutter, DNP, AGNP-C [place] [facility] [phone] (work cell) [phone] (office) 01/31/18 11:21 AM

## 2018-02-01 ENCOUNTER — Encounter: Payer: Self-pay | Admitting: *Deleted

## 2019-02-16 ENCOUNTER — Telehealth: Payer: Self-pay | Admitting: *Deleted

## 2019-02-16 NOTE — Telephone Encounter (Signed)
Left message for patient to notify them that it is time to schedule annual low dose lung cancer screening CT scan. Instructed patient to call back to verify information prior to the scan being scheduled.  

## 2019-05-22 ENCOUNTER — Telehealth: Payer: Self-pay

## 2019-05-22 NOTE — Telephone Encounter (Signed)
Left message for patient to notify them that it is time to schedule annual low dose lung cancer screening CT scan. Instructed patient to call back ([phone]) to verify information prior to the scan being scheduled.  

## 2019-05-29 ENCOUNTER — Encounter: Payer: Self-pay | Admitting: *Deleted

## 2019-09-09 ENCOUNTER — Ambulatory Visit: Payer: Medicare Other | Attending: Internal Medicine

## 2019-09-09 DIAGNOSIS — Z23 Encounter for immunization: Secondary | ICD-10-CM

## 2019-09-09 NOTE — Progress Notes (Signed)
   [facility]  Name:  DATRELL DUNTON Sr.    MRN: [id number] DOB: 05/20/1956  09/09/2019  Mr. Mccue was observed post Covid-19 immunization for 15 minutes without incident. He was provided with Vaccine Information Sheet and instruction to access the V-Safe system.   Mr. Bonnette was instructed to call 911 with any severe reactions post vaccine: Marland Kitchen Difficulty breathing  . Swelling of face and throat  . A fast heartbeat  . A bad rash all over body  . Dizziness and weakness   Immunizations Administered    Name Date Dose VIS Date Route   Pfizer COVID-19 Vaccine 09/09/2019  5:35 PM 0.3 mL 06/01/2019 Intramuscular   Manufacturer: [employer], [employer]   Lot: [id number]   NDC: [address]-0

## 2019-09-30 ENCOUNTER — Ambulatory Visit: Payer: Medicare Other | Attending: Internal Medicine

## 2019-09-30 DIAGNOSIS — Z23 Encounter for immunization: Secondary | ICD-10-CM

## 2019-09-30 NOTE — Progress Notes (Signed)
   [facility]  Name:  Jesse KRAAI Sr.    MRN: [id number] DOB: 09-Dec-1955  09/30/2019  [name] was observed post Covid-19 immunization for 15 minutes without incident. He was provided with Vaccine Information Sheet and instruction to access the V-Safe system.   [name] was instructed to call 911 with any severe reactions post vaccine: Marland Kitchen Difficulty breathing  . Swelling of face and throat  . A fast heartbeat  . A bad rash all over body  . Dizziness and weakness   Immunizations Administered    Name Date Dose VIS Date Route   Pfizer COVID-19 Vaccine 09/30/2019  5:38 PM 0.3 mL 06/01/2019 Intramuscular   Manufacturer: [employer], [employer]   Lot: [id number]   NDC: [address]-0

## 2020-05-30 ENCOUNTER — Other Ambulatory Visit: Payer: Self-pay | Admitting: Internal Medicine

## 2020-05-30 ENCOUNTER — Other Ambulatory Visit: Payer: Self-pay | Admitting: *Deleted

## 2020-05-30 DIAGNOSIS — Z87891 Personal history of nicotine dependence: Secondary | ICD-10-CM

## 2020-05-30 DIAGNOSIS — Z122 Encounter for screening for malignant neoplasm of respiratory organs: Secondary | ICD-10-CM

## 2020-05-30 NOTE — Progress Notes (Signed)
Contacted and scheduled for lung screening scan. Patient is a former smoker with quit date of 11/2016, 34.5 pack year history.

## 2020-06-10 ENCOUNTER — Other Ambulatory Visit: Payer: Self-pay

## 2020-06-10 ENCOUNTER — Ambulatory Visit
Admission: RE | Admit: 2020-06-10 | Discharge: 2020-06-10 | Disposition: A | Discharge status: Home / Self Care | Payer: Medicare Other | Source: Ambulatory Visit | Attending: Oncology | Admitting: Oncology

## 2020-06-10 DIAGNOSIS — Z122 Encounter for screening for malignant neoplasm of respiratory organs: Secondary | ICD-10-CM | POA: Diagnosis present

## 2020-06-10 DIAGNOSIS — Z87891 Personal history of nicotine dependence: Secondary | ICD-10-CM | POA: Diagnosis not present

## 2020-06-12 ENCOUNTER — Encounter: Payer: Self-pay | Admitting: *Deleted

## 2020-06-12 ENCOUNTER — Ambulatory Visit: Admission: RE | Admit: 2020-06-12 | Payer: Medicare Other | Source: Ambulatory Visit

## 2020-06-26 ENCOUNTER — Other Ambulatory Visit: Payer: Self-pay

## 2020-06-26 ENCOUNTER — Ambulatory Visit
Admission: RE | Admit: 2020-06-26 | Discharge: 2020-06-26 | Disposition: A | Discharge status: Home / Self Care | Payer: Medicare Other | Source: Ambulatory Visit | Attending: Internal Medicine | Admitting: Internal Medicine

## 2020-06-26 DIAGNOSIS — Z87891 Personal history of nicotine dependence: Secondary | ICD-10-CM

## 2021-08-04 ENCOUNTER — Other Ambulatory Visit: Payer: Self-pay | Admitting: *Deleted

## 2021-08-04 DIAGNOSIS — F17210 Nicotine dependence, cigarettes, uncomplicated: Secondary | ICD-10-CM

## 2021-08-04 DIAGNOSIS — Z87891 Personal history of nicotine dependence: Secondary | ICD-10-CM

## 2021-08-18 ENCOUNTER — Other Ambulatory Visit: Payer: Self-pay

## 2021-08-18 ENCOUNTER — Ambulatory Visit
Admission: RE | Admit: 2021-08-18 | Discharge: 2021-08-18 | Disposition: A | Discharge status: Home / Self Care | Payer: Medicare Other | Source: Ambulatory Visit | Attending: Acute Care | Admitting: Acute Care

## 2021-08-18 DIAGNOSIS — Z87891 Personal history of nicotine dependence: Secondary | ICD-10-CM | POA: Diagnosis present

## 2021-08-18 DIAGNOSIS — F17210 Nicotine dependence, cigarettes, uncomplicated: Secondary | ICD-10-CM | POA: Diagnosis not present

## 2021-08-20 ENCOUNTER — Other Ambulatory Visit: Payer: Self-pay | Admitting: Acute Care

## 2021-08-20 DIAGNOSIS — Z87891 Personal history of nicotine dependence: Secondary | ICD-10-CM

## 2022-07-21 ENCOUNTER — Telehealth: Payer: Self-pay | Admitting: Acute Care

## 2022-07-21 NOTE — Telephone Encounter (Signed)
Returned call from VM.  No answer and phone disconnected after several rings. Unable to leave VM

## 2022-07-21 NOTE — Telephone Encounter (Signed)
Patient returned call. Inquiring about LDCT appt.  Confirmed appt for 08/18/22 which was already scheduled.

## 2022-08-18 ENCOUNTER — Ambulatory Visit
Admission: RE | Admit: 2022-08-18 | Discharge: 2022-08-18 | Disposition: A | Discharge status: Home / Self Care | Payer: Medicare Other | Source: Ambulatory Visit | Attending: Acute Care | Admitting: Acute Care

## 2022-08-18 DIAGNOSIS — I700 Atherosclerosis of aorta: Secondary | ICD-10-CM | POA: Diagnosis not present

## 2022-08-18 DIAGNOSIS — I2510 Atherosclerotic heart disease of native coronary artery without angina pectoris: Secondary | ICD-10-CM | POA: Diagnosis not present

## 2022-08-18 DIAGNOSIS — Z87891 Personal history of nicotine dependence: Secondary | ICD-10-CM | POA: Diagnosis present

## 2022-08-18 DIAGNOSIS — Z122 Encounter for screening for malignant neoplasm of respiratory organs: Secondary | ICD-10-CM | POA: Diagnosis not present

## 2022-08-20 ENCOUNTER — Other Ambulatory Visit: Payer: Self-pay

## 2022-08-20 DIAGNOSIS — F17210 Nicotine dependence, cigarettes, uncomplicated: Secondary | ICD-10-CM

## 2022-08-20 DIAGNOSIS — Z87891 Personal history of nicotine dependence: Secondary | ICD-10-CM

## 2023-08-25 ENCOUNTER — Other Ambulatory Visit: Payer: Self-pay | Admitting: Acute Care

## 2023-08-25 DIAGNOSIS — F17210 Nicotine dependence, cigarettes, uncomplicated: Secondary | ICD-10-CM

## 2023-08-25 DIAGNOSIS — Z87891 Personal history of nicotine dependence: Secondary | ICD-10-CM

## 2023-09-01 ENCOUNTER — Ambulatory Visit: Admission: RE | Admit: 2023-09-01 | Source: Ambulatory Visit

## 2023-09-08 ENCOUNTER — Ambulatory Visit
Admission: RE | Admit: 2023-09-08 | Discharge: 2023-09-08 | Disposition: A | Discharge status: Home / Self Care | Source: Ambulatory Visit | Attending: Acute Care | Admitting: Acute Care

## 2023-09-08 DIAGNOSIS — Z87891 Personal history of nicotine dependence: Secondary | ICD-10-CM | POA: Diagnosis present

## 2023-09-08 DIAGNOSIS — F17210 Nicotine dependence, cigarettes, uncomplicated: Secondary | ICD-10-CM | POA: Insufficient documentation

## 2023-10-17 ENCOUNTER — Other Ambulatory Visit: Payer: Self-pay | Admitting: Acute Care

## 2023-10-17 DIAGNOSIS — Z87891 Personal history of nicotine dependence: Secondary | ICD-10-CM

## 2023-10-17 DIAGNOSIS — Z122 Encounter for screening for malignant neoplasm of respiratory organs: Secondary | ICD-10-CM

## 2024-03-09 ENCOUNTER — Ambulatory Visit (INDEPENDENT_AMBULATORY_CARE_PROVIDER_SITE_OTHER): Admitting: Urology

## 2024-03-09 ENCOUNTER — Encounter: Payer: Self-pay | Admitting: Urology

## 2024-03-09 VITALS — BP 146/100 | HR 92 | Ht 76.0 in | Wt 303.0 lb

## 2024-03-09 DIAGNOSIS — L0292 Furuncle, unspecified: Secondary | ICD-10-CM

## 2024-03-09 NOTE — Progress Notes (Signed)
   03/09/2024 8:06 AM   Jesse JONELLE Bertrand Sr. Dec 21, 1955 [id number]  Referring provider: Cecily Katz, PA-C [address] [place],  [place] [address]  Chief Complaint  Patient presents with   Other    Right groin pain     HPI: Jesse Ashley Sr. is a 68 y.o. male referred for groin abscess.  History of recurrent abscesses buttock, groin region Saw PCP Carlin Blamer 02/27/2024 with an area in the right groin region.  He was also felt to have a scrotal mass.  Scrotal ultrasound confirmed right inguinal mass.  No other scrotal masses.  He did have small, bilateral varicoceles. Presently has no complaints.  His abscess has resolved and is no longer draining   PMH: History reviewed. No pertinent past medical history.  Surgical History: History reviewed. No pertinent surgical history.  Home Medications:  Allergies as of 03/09/2024   No Known Allergies      Medication List        Accurate as of March 09, 2024  8:06 AM. If you have any questions, ask your nurse or doctor.          acetaminophen 500 MG tablet Commonly known as: TYLENOL Take 1,000 mg by mouth.   ascorbic acid 500 MG tablet Commonly known as: VITAMIN C Take 500 mg by mouth.   atorvastatin 10 MG tablet Commonly known as: LIPITOR   cyclobenzaprine 10 MG tablet Commonly known as: FLEXERIL Take 10 mg by mouth.   FISH OIL PO Take by mouth.   gabapentin 600 MG tablet Commonly known as: NEURONTIN   levETIRAcetam 750 MG tablet Commonly known as: KEPPRA Take 750 mg by mouth.   lisinopril 5 MG tablet Commonly known as: ZESTRIL   sildenafil  20 MG tablet Commonly known as: REVATIO  5 tabs 1 hour prior to intercourse   VITAMIN B COMPLEX PO Take by mouth.   Vitamin D3 10 MCG (400 UNIT) Caps Take by mouth.   vitamin E 45 MG (100 UNITS) capsule Take by mouth.        Allergies: No Known Allergies  Family History: History reviewed. No pertinent family history.  Social History:   reports that he quit smoking about 7 years ago. His smoking use included cigarettes. He started smoking about 53 years ago. He has a 34.5 pack-year smoking history. He does not have any smokeless tobacco history on file. No history on file for alcohol use and drug use.   Physical Exam: BP (!) 146/100   Pulse 92   Ht 6' 4 (1.93 m)   Wt (!) 303 lb (137.4 kg)   BMI 36.88 kg/m   Constitutional:  Alert, No acute distress. HEENT: [place] AT Respiratory: Normal respiratory effort, no increased work of breathing. GU: Phallus uncircumcised without lesions.  Testes descended bilateral without masses or tenderness.  No intrascrotal masses or varicocele appreciated.  His scrotal skin is thickened and some increased adipose tissue inferior aspect scrotum Psychiatric: Normal mood and affect.   Assessment & Plan:    1.  Recurrent abscesses inguinal region/buttock Exam not consistent with hidradenitis Consider dermatology referral Follow-up as needed No significant findings on scrotal ultrasound    Glendia JAYSON Barba, MD  [facility] [address] [place], [place] [address] [phone]

## 2024-09-10 ENCOUNTER — Ambulatory Visit
